# Patient Record
Sex: Female | Born: 1970 | Race: White | Hispanic: No | Marital: Married | Smoking: Never smoker
Health system: Southern US, Community
[De-identification: ages and names within clinical notes are randomized; demographics above are authoritative.]

## PROBLEM LIST (undated history)

## (undated) DIAGNOSIS — E079 Disorder of thyroid, unspecified: Secondary | ICD-10-CM

## (undated) HISTORY — PX: ABDOMINAL HYSTERECTOMY: SHX81

## (undated) HISTORY — PX: TONSILLECTOMY: SUR1361

---

## 1998-12-24 ENCOUNTER — Other Ambulatory Visit: Admission: RE | Admit: 1998-12-24 | Discharge: 1998-12-24 | Payer: Self-pay | Admitting: Gynecology

## 1999-10-05 ENCOUNTER — Other Ambulatory Visit: Admission: RE | Admit: 1999-10-05 | Discharge: 1999-10-05 | Payer: Self-pay | Admitting: Gynecology

## 2000-03-06 ENCOUNTER — Other Ambulatory Visit: Admission: RE | Admit: 2000-03-06 | Discharge: 2000-03-06 | Payer: Self-pay | Admitting: Gynecology

## 2000-09-07 ENCOUNTER — Other Ambulatory Visit: Admission: RE | Admit: 2000-09-07 | Discharge: 2000-09-07 | Payer: Self-pay | Admitting: Gynecology

## 2001-07-03 ENCOUNTER — Other Ambulatory Visit: Admission: RE | Admit: 2001-07-03 | Discharge: 2001-07-03 | Payer: Self-pay | Admitting: Gynecology

## 2002-05-21 ENCOUNTER — Observation Stay (HOSPITAL_COMMUNITY): Admission: AD | Admit: 2002-05-21 | Discharge: 2002-05-22 | Payer: Self-pay | Admitting: Gynecology

## 2002-05-21 ENCOUNTER — Encounter: Payer: Self-pay | Admitting: Gynecology

## 2002-05-21 ENCOUNTER — Encounter (INDEPENDENT_AMBULATORY_CARE_PROVIDER_SITE_OTHER): Payer: Self-pay

## 2007-12-04 ENCOUNTER — Emergency Department (HOSPITAL_BASED_OUTPATIENT_CLINIC_OR_DEPARTMENT_OTHER): Admission: EM | Admit: 2007-12-04 | Discharge: 2007-12-05 | Payer: Self-pay | Admitting: Emergency Medicine

## 2008-08-06 ENCOUNTER — Emergency Department (HOSPITAL_BASED_OUTPATIENT_CLINIC_OR_DEPARTMENT_OTHER): Admission: EM | Admit: 2008-08-06 | Discharge: 2008-08-06 | Payer: Self-pay | Admitting: Emergency Medicine

## 2009-05-22 ENCOUNTER — Emergency Department (HOSPITAL_BASED_OUTPATIENT_CLINIC_OR_DEPARTMENT_OTHER): Admission: EM | Admit: 2009-05-22 | Discharge: 2009-05-22 | Payer: Self-pay | Admitting: Emergency Medicine

## 2010-02-01 ENCOUNTER — Ambulatory Visit: Payer: Self-pay | Admitting: Diagnostic Radiology

## 2010-02-01 ENCOUNTER — Emergency Department (HOSPITAL_BASED_OUTPATIENT_CLINIC_OR_DEPARTMENT_OTHER): Admission: EM | Admit: 2010-02-01 | Discharge: 2010-02-01 | Payer: Self-pay | Admitting: Emergency Medicine

## 2010-08-11 LAB — URINALYSIS, ROUTINE W REFLEX MICROSCOPIC
Bilirubin Urine: NEGATIVE
Glucose, UA: NEGATIVE mg/dL
Hgb urine dipstick: NEGATIVE
Specific Gravity, Urine: 1.022 (ref 1.005–1.030)

## 2010-08-11 LAB — CBC
HCT: 42.5 % (ref 36.0–46.0)
Hemoglobin: 14.6 g/dL (ref 12.0–15.0)
MCH: 33.4 pg (ref 26.0–34.0)
MCHC: 34.4 g/dL (ref 30.0–36.0)
MCV: 97.1 fL (ref 78.0–100.0)
Platelets: 291 10*3/uL (ref 150–400)
RBC: 4.37 MIL/uL (ref 3.87–5.11)
RDW: 11.9 % (ref 11.5–15.5)
WBC: 18.8 10*3/uL — ABNORMAL HIGH (ref 4.0–10.5)

## 2010-08-11 LAB — COMPREHENSIVE METABOLIC PANEL
ALT: 18 U/L (ref 0–35)
AST: 23 U/L (ref 0–37)
Albumin: 4.3 g/dL (ref 3.5–5.2)
Alkaline Phosphatase: 114 U/L (ref 39–117)
BUN: 16 mg/dL (ref 6–23)
CO2: 26 mEq/L (ref 19–32)
Calcium: 9.7 mg/dL (ref 8.4–10.5)
Chloride: 105 mEq/L (ref 96–112)
Creatinine, Ser: 0.7 mg/dL (ref 0.4–1.2)
GFR calc Af Amer: >60 mL/min (ref >60)
GFR calc non Af Amer: >60 mL/min (ref >60)
Glucose, Bld: 93 mg/dL (ref 70–99)
Potassium: 4.1 mEq/L (ref 3.5–5.1)
Sodium: 142 mEq/L (ref 135–145)
Total Bilirubin: 0.8 mg/dL (ref 0.3–1.2)
Total Protein: 8 g/dL (ref 6.0–8.3)

## 2010-08-11 LAB — DIFFERENTIAL
Basophils Absolute: 0.1 10*3/uL (ref 0.0–0.1)
Basophils Relative: 1 % (ref 0–1)
Eosinophils Absolute: 0.1 10*3/uL (ref 0.0–0.7)
Eosinophils Relative: 0 % (ref 0–5)
Lymphocytes Relative: 5 % — ABNORMAL LOW (ref 12–46)
Lymphs Abs: 0.9 10*3/uL (ref 0.7–4.0)
Monocytes Absolute: 0.9 10*3/uL (ref 0.1–1.0)
Monocytes Relative: 5 % (ref 3–12)
Neutro Abs: 16.8 10*3/uL — ABNORMAL HIGH (ref 1.7–7.7)
Neutrophils Relative %: 89 % — ABNORMAL HIGH (ref 43–77)

## 2010-08-29 LAB — URINE CULTURE

## 2010-08-29 LAB — URINALYSIS, ROUTINE W REFLEX MICROSCOPIC
Bilirubin Urine: NEGATIVE
Ketones, ur: NEGATIVE mg/dL
Protein, ur: 100 mg/dL — AB
Urobilinogen, UA: 0.2 mg/dL (ref 0.0–1.0)

## 2010-08-29 LAB — BASIC METABOLIC PANEL
BUN: 17 mg/dL (ref 6–23)
CO2: 28 mEq/L (ref 19–32)
Calcium: 9.8 mg/dL (ref 8.4–10.5)
GFR calc non Af Amer: >60 mL/min (ref >60)
Glucose, Bld: 104 mg/dL — ABNORMAL HIGH (ref 70–99)

## 2010-08-29 LAB — URINE MICROSCOPIC-ADD ON

## 2010-08-29 LAB — PREGNANCY, URINE: Preg Test, Ur: NEGATIVE

## 2010-09-08 LAB — CBC
HCT: 49.8 % — ABNORMAL HIGH (ref 36.0–46.0)
MCHC: 34.3 g/dL (ref 30.0–36.0)
Platelets: 340 10*3/uL (ref 150–400)
RDW: 11.6 % (ref 11.5–15.5)

## 2010-09-08 LAB — URINALYSIS, ROUTINE W REFLEX MICROSCOPIC
Glucose, UA: NEGATIVE mg/dL
Leukocytes, UA: NEGATIVE
Protein, ur: 100 mg/dL — AB
Specific Gravity, Urine: 1.022 (ref 1.005–1.030)
pH: 5.5 (ref 5.0–8.0)

## 2010-09-08 LAB — URINE MICROSCOPIC-ADD ON

## 2010-09-08 LAB — COMPREHENSIVE METABOLIC PANEL
Albumin: 5.4 g/dL — ABNORMAL HIGH (ref 3.5–5.2)
Alkaline Phosphatase: 100 U/L (ref 39–117)
BUN: 14 mg/dL (ref 6–23)
Calcium: 10.8 mg/dL — ABNORMAL HIGH (ref 8.4–10.5)
Potassium: 3.6 mEq/L (ref 3.5–5.1)
Sodium: 143 mEq/L (ref 135–145)
Total Protein: 9.4 g/dL — ABNORMAL HIGH (ref 6.0–8.3)

## 2010-09-08 LAB — DIFFERENTIAL
Basophils Relative: 1 % (ref 0–1)
Eosinophils Absolute: 0 10*3/uL (ref 0.0–0.7)
Eosinophils Relative: 0 % (ref 0–5)
Lymphocytes Relative: 5 % — ABNORMAL LOW (ref 12–46)
Neutro Abs: 16.9 10*3/uL — ABNORMAL HIGH (ref 1.7–7.7)
Neutrophils Relative %: 90 % — ABNORMAL HIGH (ref 43–77)

## 2010-10-14 NOTE — Op Note (Signed)
NAME:  Robin Brennan, Robin Brennan                           ACCOUNT NO.:  [id number]   MEDICAL RECORD NO.:  [id number]                   PATIENT TYPE:  INP   LOCATION:  9116                                 FACILITY:  [facility]   PHYSICIAN:  Ivor Costa. Farrel Gobble, M.D.              DATE OF BIRTH:  12-17-1970   DATE OF PROCEDURE:  05/21/2002  DATE OF DISCHARGE:  05/22/2002                                 OPERATIVE REPORT   PREOPERATIVE DIAGNOSES:  1. Inappropriately rising beta human chorionic gonadotropin.  2. Elevated liver function tests.  3. No villi on office dilatation and curettage.  4. Presumed left ectopic.   POSTOPERATIVE DIAGNOSIS:  1. Inappropriately rising beta human chorionic gonadotropin.  2. Elevated liver function tests.  3. No villi on office dilatation and curettage.  4. Presumed left ectopic.  5. Hemoperitoneum.  6. Bowel adhesions.  7. Right ovarian cyst.   PROCEDURES:  1. Diagnostic laparoscopy.  2. Lysis of adhesions.  3. Right ovarian cystectomy.  4. Dilation and curettage.  5. Evacuation of hemoperitoneum.   SURGEON:  Ivor Costa. Farrel Gobble, M.D.   ANESTHESIA:  General.   ESTIMATED BLOOD LOSS:  Estimated blood loss intraoperatively was minimal.   FINDINGS:  There were anterior abdominal wall bowel adhesions.  The bowel  was also adherent to the left adnexa.  There were 200 cc of clotted and  nonclotted blood in the pelvis.  Both tubes were bilaterally normal as well  as lush fimbriae seen bilaterally.  The uterus was unremarkable.  There was  a large right-sided ovarian cyst.   COMPLICATIONS:  None.   PATHOLOGY:  Endometrial curettings and right ovarian cyst wall.   DESCRIPTION OF PROCEDURE:  The patient was taken to the operating room and  general anesthesia was induced, placed in the dorsal lithotomy position,  prepped and draped in the usual sterile fashion.  A bivalve speculum was  placed in the vagina and the cervix was visualized,  stabilized with a  single-tooth  tenaculum.  The uterine manipulator was then placed.  Attention  was then turned to the abdomen after gloves were changed and an  infraumbilical incision was made through the scalpel, through which the  Veress needle was inserted.  Free flow of fluid was noted.  Pneumoperitoneum  was created until tympany was appreciated above the liver, after which a #10  disposable trocar was inserted through the infraumbilical port.  Placement  in the abdominal cavity was confirmed.  Findings were as noted.  The bowel  adhesions in the pelvis somewhat limited our access.  Using the monopolar  shears, the anterior abdominal wall adhesions were sharply taken down.  They  were noted to be hemostatic.  Once this was done, a #5 port was placed in  the suprapubic area under direct visualization, through which a suction  irrigator was placed and the hemoperitoneum was evacuated.  Inspection of  the adnexa was  somewhat limited secondary to the bowel adhesions on the  left, and we were unable to adequately see most of the tube.  The bowel  adhesions were then sharply and bluntly taken down and the left tube was  able to be evaluated.  It was noted to be unremarkable and was similar when  compared to the right.  The tube was gently probed with the suction  irrigator and no abnormalities were appreciated.  There was a rather large  cyst on the right.  The tubo-ovarian ligament was then grasped.  A second  port on the right lower quadrant was placed under direct visualization.  Using the two ports, the ovary was stabilized and a cystotomy was performed  for what appeared to be benign fluid.  At the time, the cyst wall was then  bluntly dissected off the ovary using the three available ports.  The base  of the cyst was irrigated with copious amounts of saline.  There was a small  amount of bleeding that was treated with the Kleppinger's.  Re-irrigation  confirmed hemostasis.  Again we reinspected the two fimbriae and  tubes, and  again symmetry was appreciated.  Because we could not find any evidence of  ectopic pregnancy, our suspicion was that perhaps she ruptured the ectopic  from the fimbriated end or it was not a tubal pregnancy.  The instruments  were then removed and pneumoperitoneum was released.  The infraumbilical  port was closed with a figure-of-eight of 0 Vicryl.  The infraumbilical skin  was closed with 3-0 plain.  The lower ports were closed with tincture of  Benzoin and Steri-Strips.  All three ports were injected with 0.25% Marcaine  solution for a total of 10 cc.  Because the ectopic had not been seen and  the D&C was done in the office, we elected to go ahead and do another sharp  curetting.  The bivalve speculum was placed in the vagina again.  The  uterine manipulator was removed.  The cervix was stabilized with a single-  tooth tenaculum.  It sounded to 8 cm.  It was dilated up to 21 [place].  A  gentle curettage was performed.  Good uterine cri was noted.  Scant tissue  was obtained.  This was also sent for pathology.  The instruments were then  removed from the vagina.  The cervix was noted to be hemostatic.  The  patient tolerated the procedure well.  Sponge, needle, and lap counts were  correct x2.  She was extubated in the OR and transferred to the PACU in  stable condition.                                               Ivor Costa. Farrel Gobble, M.D.    THL/MEDQ  D:  05/21/2002  T:  05/23/2002  Job:  [id number]

## 2010-10-14 NOTE — Consult Note (Signed)
NAME:  Robin Brennan, Robin Brennan                           ACCOUNT NO.:  [id number]   MEDICAL RECORD NO.:  [id number]                   PATIENT TYPE:  INP   LOCATION:  9116                                 FACILITY:  [facility]   PHYSICIAN:  Ivor Costa. Farrel Gobble, M.D.              DATE OF BIRTH:  07-26-1970   DATE OF CONSULTATION:  DATE OF DISCHARGE:  05/22/2002                                   CONSULTATION   REASON FOR CONSULTATION:  The patient is a 40 year old with a history of  inappropriately-rising beta hCG who underwent a ___________in the office  which came back consistent with secretory endometrium and no villi.  The  patient then presented to triage for appropriate lab work for methotrexate  therapy.  The patient had a CBC done which was unremarkable with normal  white count, H&H, and platelets of 226.  She also had a normal differential.  She had an SGOT that came back elevated at 80; however, she does not have  any GI symptomatology.  It was repeated with a full liver function panel  which was normal with the exception of SGOT then was 80; SGPT was 110.   These findings were discussed with the patient and her husband.  Because of  the elevation in her liver function tests which were about two-and-a-half  times normal, she is therefore not a candidate for methotrexate therapy.  Therefore, we would recommend going ahead and doing a laparoscopic  salpingostomy, possible salpingectomy.  Unfortunately, the patient has eaten  just prior to getting lab work done.  She is asymptomatic, i.e., no nausea  or vomiting.  She has no icterus or jaundice.  No right upper quadrant pain.  No nausea or vomiting.  We briefly discussed the differential for isolated  elevated liver function tests.  We briefly reviewed laparoscopic procedure  including possible risk of loss of the tube.  The patient is asymptomatic.  Because it is Christmas Eve and she has just eaten, she is not a candidate  for surgery until the  middle of the evening.  It is currently 3:00.  We  recommend that she come back for surgery tomorrow.  Because it is Christmas  Day, the patient requested she could possibly come back on the 26th then as  she is completely asymptomatic and her bimanual exam was negative.  The  patient was given strict precautions that should she develop any left lower  quadrant pain, she is to call immediately; otherwise, she is scheduled for  surgery on the 26th at 9:15 a.m.  In the meantime, we will continue to  evaluate her isolated elevated liver function tests with a hepatitis panel  and a gallbladder ultrasound.  All questions were addressed, and we will  plan on seeing her in the morning of the 26th.  Ivor Costa. Farrel Gobble, M.D.    THL/MEDQ  D:  05/21/2002  T:  05/22/2002  Job:  [id number]

## 2011-02-23 LAB — COMPREHENSIVE METABOLIC PANEL WITH GFR
ALT: 17
AST: 29
Albumin: 4.8
Alkaline Phosphatase: 74
BUN: 11
CO2: 25
Calcium: 9.4
Chloride: 104
Creatinine, Ser: 0.8
GFR calc non Af Amer: >60
Glucose, Bld: 100 — ABNORMAL HIGH
Potassium: 3.8
Sodium: 141
Total Bilirubin: 0.9
Total Protein: 8.1

## 2011-02-23 LAB — URINALYSIS, ROUTINE W REFLEX MICROSCOPIC
Bilirubin Urine: NEGATIVE
Glucose, UA: NEGATIVE
Hgb urine dipstick: NEGATIVE
Ketones, ur: 15 — AB
Nitrite: NEGATIVE
Protein, ur: NEGATIVE
Specific Gravity, Urine: 1.029
Urobilinogen, UA: 0.2
pH: 5.5

## 2011-02-23 LAB — DIFFERENTIAL
Basophils Absolute: 0.1
Basophils Relative: 1
Eosinophils Absolute: 0.1
Eosinophils Relative: 1
Lymphocytes Relative: 9 — ABNORMAL LOW
Lymphs Abs: 0.9
Monocytes Absolute: 0.6
Monocytes Relative: 6
Neutro Abs: 8.6 — ABNORMAL HIGH
Neutrophils Relative %: 83 — ABNORMAL HIGH
Smear Review: NORMAL

## 2011-02-23 LAB — LIPASE, BLOOD: Lipase: 47

## 2011-02-23 LAB — CBC
MCHC: 34.8
MCV: 95.6
RBC: 4.73
RDW: 11.4 — ABNORMAL LOW

## 2012-11-12 ENCOUNTER — Emergency Department (HOSPITAL_BASED_OUTPATIENT_CLINIC_OR_DEPARTMENT_OTHER)
Admission: EM | Admit: 2012-11-12 | Discharge: 2012-11-13 | Disposition: A | Discharge status: Home / Self Care | Payer: BC Managed Care – PPO | Attending: Emergency Medicine | Admitting: Emergency Medicine

## 2012-11-12 ENCOUNTER — Encounter (HOSPITAL_BASED_OUTPATIENT_CLINIC_OR_DEPARTMENT_OTHER): Payer: Self-pay

## 2012-11-12 DIAGNOSIS — Z90710 Acquired absence of both cervix and uterus: Secondary | ICD-10-CM | POA: Insufficient documentation

## 2012-11-12 DIAGNOSIS — R197 Diarrhea, unspecified: Secondary | ICD-10-CM | POA: Insufficient documentation

## 2012-11-12 DIAGNOSIS — R509 Fever, unspecified: Secondary | ICD-10-CM | POA: Insufficient documentation

## 2012-11-12 DIAGNOSIS — R112 Nausea with vomiting, unspecified: Secondary | ICD-10-CM

## 2012-11-12 DIAGNOSIS — R1084 Generalized abdominal pain: Secondary | ICD-10-CM | POA: Insufficient documentation

## 2012-11-12 LAB — CBC WITH DIFFERENTIAL/PLATELET
Eosinophils Relative: 1 % (ref 0–5)
HCT: 41.2 % (ref 36.0–46.0)
Hemoglobin: 14.6 g/dL (ref 12.0–15.0)
Lymphocytes Relative: 4 % — ABNORMAL LOW (ref 12–46)
Lymphs Abs: 0.5 10*3/uL — ABNORMAL LOW (ref 0.7–4.0)
MCV: 95.8 fL (ref 78.0–100.0)
Monocytes Absolute: 0.7 10*3/uL (ref 0.1–1.0)
Monocytes Relative: 5 % (ref 3–12)
Neutro Abs: 12.4 10*3/uL — ABNORMAL HIGH (ref 1.7–7.7)
RDW: 12.2 % (ref 11.5–15.5)
WBC: 13.7 10*3/uL — ABNORMAL HIGH (ref 4.0–10.5)

## 2012-11-12 LAB — COMPREHENSIVE METABOLIC PANEL
AST: 20 U/L (ref 0–37)
Albumin: 4.5 g/dL (ref 3.5–5.2)
BUN: 22 mg/dL (ref 6–23)
Chloride: 100 mEq/L (ref 96–112)
Creatinine, Ser: 0.7 mg/dL (ref 0.50–1.10)
Potassium: 3.5 mEq/L (ref 3.5–5.1)
Total Bilirubin: 0.9 mg/dL (ref 0.3–1.2)
Total Protein: 7.4 g/dL (ref 6.0–8.3)

## 2012-11-12 LAB — LIPASE, BLOOD: Lipase: 25 U/L (ref 11–59)

## 2012-11-12 MED ORDER — SODIUM CHLORIDE 0.9 % IV SOLN
Freq: Once | INTRAVENOUS | Status: AC
  Administered 2012-11-12: 22:00:00 via INTRAVENOUS

## 2012-11-12 MED ORDER — MORPHINE SULFATE 4 MG/ML IJ SOLN: 4.0000 mg | Freq: Once | INTRAMUSCULAR | Status: DC

## 2012-11-12 MED ORDER — SODIUM CHLORIDE 0.9 % IV SOLN: 1000.0000 mL | INTRAVENOUS | Status: DC

## 2012-11-12 MED ORDER — ONDANSETRON HCL 4 MG/2ML IJ SOLN
INTRAMUSCULAR | Status: AC
  Filled 2012-11-12: qty 2

## 2012-11-12 MED ORDER — SODIUM CHLORIDE 0.9 % IV SOLN
1000.0000 mL | Freq: Once | INTRAVENOUS | Status: AC
  Administered 2012-11-12: 1000 mL via INTRAVENOUS

## 2012-11-12 MED ORDER — ONDANSETRON HCL 4 MG/2ML IJ SOLN: 4.0000 mg | Freq: Once | INTRAMUSCULAR | Status: DC

## 2012-11-12 MED ORDER — ONDANSETRON HCL 4 MG/2ML IJ SOLN
4.0000 mg | Freq: Once | INTRAMUSCULAR | Status: AC
  Administered 2012-11-12: 4 mg via INTRAVENOUS

## 2012-11-12 NOTE — ED Notes (Signed)
N/v/d started today-2 children with GI s/s

## 2012-11-12 NOTE — ED Provider Notes (Signed)
History     CSN: [id number]  Arrival date & time 11/12/12  2057   First MD Initiated Contact with Patient 11/12/12 2136      Chief Complaint  Patient presents with  . Emesis     Patient is a 42 y.o. female presenting with vomiting. The history is provided by the patient.  Emesis Severity:  Moderate Duration:  1 day Timing:  Intermittent Number of daily episodes:  More than 10 Emesis appearance: no blood. Feeding tolerance: nothing. Progression:  Worsening Associated symptoms: abdominal pain, diarrhea and fever   Abdominal pain:    Location:  Generalized   Severity:  Severe   Timing:  Intermittent   Chronicity:  New Diarrhea:    Quality:  Watery   Number of occurrences:  More than 10   Severity:  Severe   Duration:  1 day Pt's children had a stomach bug recently as well.  History reviewed. No pertinent past medical history.  Past Surgical History  Procedure Laterality Date  . Abdominal hysterectomy    . Tonsillectomy      No family history on file.  History  Substance Use Topics  . Smoking status: Never Smoker   . Smokeless tobacco: Not on file  . Alcohol Use: No    OB History   Grav Para Term Preterm Abortions TAB SAB Ect Mult Living                  Review of Systems  Gastrointestinal: Positive for vomiting, abdominal pain and diarrhea.  All other systems reviewed and are negative.    Allergies  Aloe; Tape; Augmentin; and Sulfa antibiotics  Home Medications   Current Outpatient Rx  Name  Route  Sig  Dispense  Refill  . ibuprofen (ADVIL,MOTRIN) 200 MG tablet   Oral   Take 200 mg by mouth every 6 (six) hours as needed for pain.         . promethazine (PHENERGAN) 25 MG tablet   Oral   Take 25 mg by mouth every 6 (six) hours as needed for nausea.         . ondansetron (ZOFRAN ODT) 8 MG disintegrating tablet   Oral   Take 1 tablet (8 mg total) by mouth every 8 (eight) hours as needed for nausea.   20 tablet   0     BP 98/57   Pulse 85  Temp(Src) 100.8 F (38.2 C) (Oral)  Resp 14  Ht 5\' 8"  (1.727 m)  Wt 135 lb (61.236 kg)  BMI 20.53 kg/m2  SpO2 100%  Physical Exam  Nursing note and vitals reviewed. Constitutional: She appears well-developed and well-nourished. No distress.  HENT:  Head: Normocephalic and atraumatic.  Right Ear: External ear normal.  Left Ear: External ear normal.  Eyes: Conjunctivae are normal. Right eye exhibits no discharge. Left eye exhibits no discharge. No scleral icterus.  Neck: Neck supple. No tracheal deviation present.  Cardiovascular: Normal rate, regular rhythm and intact distal pulses.   Pulmonary/Chest: Effort normal and breath sounds normal. No stridor. No respiratory distress. She has no wheezes. She has no rales.  Abdominal: Soft. Bowel sounds are normal. She exhibits no distension. There is generalized tenderness. There is no rigidity, no rebound and no guarding. No hernia.  Musculoskeletal: She exhibits no edema and no tenderness.  Neurological: She is alert. She has normal strength. No sensory deficit. Cranial nerve deficit:  no gross defecits noted. She exhibits normal muscle tone. She displays no seizure activity. Coordination  normal.  Skin: Skin is warm and dry. No rash noted.  Psychiatric: She has a normal mood and affect.    ED Course  Procedures (including critical care time) Medications  0.9 %  sodium chloride infusion (1,000 mLs Intravenous New Bag/Given 11/12/12 2316)    Followed by  0.9 %  sodium chloride infusion (not administered)  morphine 4 MG/ML injection 4 mg (not administered)  ondansetron (ZOFRAN) injection 4 mg (4 mg Intravenous Given 11/12/12 2143)  0.9 %  sodium chloride infusion ( Intravenous Stopped 11/12/12 2245)     Labs Reviewed  CBC WITH DIFFERENTIAL - Abnormal; Notable for the following:    WBC 13.7 (*)    Neutrophils Relative % 91 (*)    Neutro Abs 12.4 (*)    Lymphocytes Relative 4 (*)    Lymphs Abs 0.5 (*)    All other components  within normal limits  COMPREHENSIVE METABOLIC PANEL  LIPASE, BLOOD  URINALYSIS, ROUTINE W REFLEX MICROSCOPIC   No results found.   1. Nausea and vomiting in adult       MDM  I discussed performing imaging tests with the patient and her husband. She was pretty certain that her illness was a stomach virus that she got from her children. Patient states she's had this type of reaction in the past. She would prefer not to have any x-ray imaging at this time. Overall I do have a low suspicion for bowel obstruction or other emergency surgical condition.    Patient was treated with IV fluids and medications for pain and cramping. She improved significantly. On repeat exam she did not have any abdominal tenderness. Patient is comfortable with going home and she understands to return to emergency room for any worsening symptoms.        Celene Kras, MD 11/13/12 [phone]

## 2012-11-13 MED ORDER — ONDANSETRON 8 MG PO TBDP: 8.0000 mg | ORAL_TABLET | Freq: Three times a day (TID) | ORAL | Status: DC | PRN

## 2012-11-13 NOTE — ED Notes (Signed)
Pt denies pain at this time. Morphine held.

## 2020-10-06 ENCOUNTER — Emergency Department (HOSPITAL_COMMUNITY)
Admission: EM | Admit: 2020-10-06 | Discharge: 2020-10-06 | Disposition: A | Discharge status: Home / Self Care | Payer: BC Managed Care – PPO | Attending: Emergency Medicine | Admitting: Emergency Medicine

## 2020-10-06 ENCOUNTER — Emergency Department (HOSPITAL_COMMUNITY): Payer: BC Managed Care – PPO

## 2020-10-06 ENCOUNTER — Other Ambulatory Visit: Payer: Self-pay

## 2020-10-06 ENCOUNTER — Encounter (HOSPITAL_COMMUNITY): Payer: Self-pay

## 2020-10-06 DIAGNOSIS — R079 Chest pain, unspecified: Secondary | ICD-10-CM

## 2020-10-06 DIAGNOSIS — R072 Precordial pain: Secondary | ICD-10-CM

## 2020-10-06 DIAGNOSIS — R0789 Other chest pain: Secondary | ICD-10-CM | POA: Insufficient documentation

## 2020-10-06 HISTORY — DX: Disorder of thyroid, unspecified: E07.9

## 2020-10-06 LAB — CBC WITH DIFFERENTIAL/PLATELET
Abs Immature Granulocytes: 0.06 10*3/uL (ref 0.00–0.07)
Basophils Absolute: 0.1 10*3/uL (ref 0.0–0.1)
Basophils Relative: 1 %
Eosinophils Absolute: 0.2 10*3/uL (ref 0.0–0.5)
Eosinophils Relative: 2 %
HCT: 43.1 % (ref 36.0–46.0)
Hemoglobin: 14.6 g/dL (ref 12.0–15.0)
Immature Granulocytes: 1 %
Lymphocytes Relative: 19 %
Lymphs Abs: 1.8 10*3/uL (ref 0.7–4.0)
MCH: 34.2 pg — ABNORMAL HIGH (ref 26.0–34.0)
MCHC: 33.9 g/dL (ref 30.0–36.0)
MCV: 100.9 fL — ABNORMAL HIGH (ref 80.0–100.0)
Monocytes Absolute: 0.8 10*3/uL (ref 0.1–1.0)
Monocytes Relative: 9 %
Neutro Abs: 6.8 10*3/uL (ref 1.7–7.7)
Neutrophils Relative %: 68 %
Platelets: 300 10*3/uL (ref 150–400)
RBC: 4.27 MIL/uL (ref 3.87–5.11)
RDW: 11.9 % (ref 11.5–15.5)
WBC: 9.7 10*3/uL (ref 4.0–10.5)
nRBC: 0 % (ref 0.0–0.2)

## 2020-10-06 LAB — COMPREHENSIVE METABOLIC PANEL
ALT: 22 U/L (ref 0–44)
AST: 24 U/L (ref 15–41)
Albumin: 4.2 g/dL (ref 3.5–5.0)
Alkaline Phosphatase: 51 U/L (ref 38–126)
Anion gap: 10 (ref 5–15)
BUN: 14 mg/dL (ref 6–20)
CO2: 23 mmol/L (ref 22–32)
Calcium: 9.2 mg/dL (ref 8.9–10.3)
Chloride: 105 mmol/L (ref 98–111)
Creatinine, Ser: 0.78 mg/dL (ref 0.44–1.00)
GFR, Estimated: >60 mL/min (ref >60)
Glucose, Bld: 107 mg/dL — ABNORMAL HIGH (ref 70–99)
Potassium: 3.6 mmol/L (ref 3.5–5.1)
Sodium: 138 mmol/L (ref 135–145)
Total Bilirubin: 1.1 mg/dL (ref 0.3–1.2)
Total Protein: 6.9 g/dL (ref 6.5–8.1)

## 2020-10-06 LAB — TROPONIN I (HIGH SENSITIVITY)
Troponin I (High Sensitivity): <2 ng/L (ref <18)
Troponin I (High Sensitivity): 3 ng/L (ref <18)

## 2020-10-06 LAB — D-DIMER, QUANTITATIVE: D-Dimer, Quant: 0.58 ug/mL-FEU — ABNORMAL HIGH (ref 0.00–0.50)

## 2020-10-06 MED ORDER — IOHEXOL 350 MG/ML SOLN
75.0000 mL | Freq: Once | INTRAVENOUS | Status: AC | PRN
  Administered 2020-10-06: 75 mL via INTRAVENOUS

## 2020-10-06 MED ORDER — ONDANSETRON HCL 4 MG/2ML IJ SOLN
4.0000 mg | Freq: Once | INTRAMUSCULAR | Status: AC
  Administered 2020-10-06: 4 mg via INTRAVENOUS
  Filled 2020-10-06: qty 2

## 2020-10-06 MED ORDER — FENTANYL CITRATE (PF) 100 MCG/2ML IJ SOLN
25.0000 ug | INTRAMUSCULAR | Status: DC | PRN
Start: 2020-10-06 — End: 2020-10-07
  Administered 2020-10-06: 25 ug via INTRAVENOUS
  Filled 2020-10-06: qty 2

## 2020-10-06 NOTE — ED Provider Notes (Signed)
Pt cleared for Dc by Dr. Gala Romney,  Pt agreeable   Eber Hong, MD 10/06/20 2103

## 2020-10-06 NOTE — ED Provider Notes (Signed)
[facility] EMERGENCY DEPARTMENT Provider Note   CSN: [id number] Arrival date & time: 10/06/20  1316     History Chief Complaint  Patient presents with  . Chest Pain    Robin Brennan is a 50 y.o. female.  She has had similar chest pain for about 6 months off and on.  This chest pain was more severe and longer lasting.  She has not sought any evaluation prior to this point.  The history is provided by the patient.  Chest Pain Pain location:  L chest Pain quality: dull and sharp   Pain quality comment:  Started sharp, now dull Pain radiates to:  Does not radiate Pain severity:  Moderate Onset quality:  Sudden Duration:  3 hours Timing:  Constant Progression:  Unchanged Chronicity:  New Context comment:  Occurred while grocery shopping Relieved by:  Nothing Worsened by:  Deep breathing Ineffective treatments:  Nitroglycerin Associated symptoms: no abdominal pain, no back pain, no cough, no diaphoresis, no fever, no lower extremity edema, no palpitations, no shortness of breath and no vomiting   Risk factors: no coronary artery disease and no hypertension        Past Medical History:  Diagnosis Date  . Thyroid disease     There are no problems to display for this patient.   Past Surgical History:  Procedure Laterality Date  . ABDOMINAL HYSTERECTOMY    . TONSILLECTOMY       OB History   No obstetric history on file.     No family history on file.  Social History   Tobacco Use  . Smoking status: Never Smoker  . Smokeless tobacco: Never Used  Substance Use Topics  . Alcohol use: Yes    Comment: couple glasses of wine daily   . Drug use: Not Currently    Home Medications Prior to Admission medications   Medication Sig Start Date End Date Taking? Authorizing Provider  ibuprofen (ADVIL,MOTRIN) 200 MG tablet Take 200 mg by mouth every 6 (six) hours as needed for pain.    [provider]  ondansetron (ZOFRAN ODT) 8 MG  disintegrating tablet Take 1 tablet (8 mg total) by mouth every 8 (eight) hours as needed for nausea. 11/13/12   Linwood Dibbles, MD  promethazine (PHENERGAN) 25 MG tablet Take 25 mg by mouth every 6 (six) hours as needed for nausea.    [provider]    Allergies    Aloe, Tape, Augmentin [amoxicillin-pot clavulanate], and Sulfa antibiotics  Review of Systems   Review of Systems  Constitutional: Negative for chills, diaphoresis and fever.  HENT: Negative for ear pain and sore throat.   Eyes: Negative for pain and visual disturbance.  Respiratory: Negative for cough and shortness of breath.   Cardiovascular: Positive for chest pain. Negative for palpitations.  Gastrointestinal: Negative for abdominal pain and vomiting.  Genitourinary: Negative for dysuria and hematuria.  Musculoskeletal: Negative for arthralgias and back pain.  Skin: Negative for color change and rash.  Neurological: Negative for seizures and syncope.  All other systems reviewed and are negative.   Physical Exam Updated Vital Signs BP 122/76   Pulse 71   Temp 98.6 F (37 C) (Oral)   Resp 18   Wt 61.2 kg   SpO2 98%   BMI 20.51 kg/m   Physical Exam Vitals and nursing note reviewed.  Constitutional:      General: She is not in acute distress.    Appearance: She is well-developed.  HENT:  Head: Normocephalic and atraumatic.  Eyes:     Conjunctiva/sclera: Conjunctivae normal.  Cardiovascular:     Rate and Rhythm: Normal rate and regular rhythm.     Heart sounds: No murmur heard.   Pulmonary:     Effort: Pulmonary effort is normal. No respiratory distress.     Breath sounds: Normal breath sounds.  Musculoskeletal:     Cervical back: Neck supple.  Skin:    General: Skin is warm and dry.  Neurological:     General: No focal deficit present.     Mental Status: She is alert.  Psychiatric:        Mood and Affect: Mood normal.     ED Results / Procedures / Treatments   Labs (all labs ordered  are listed, but only abnormal results are displayed) Labs Reviewed  D-DIMER, QUANTITATIVE - Abnormal; Notable for the following components:      Result Value   D-Dimer, Quant 0.58 (*)    All other components within normal limits  COMPREHENSIVE METABOLIC PANEL - Abnormal; Notable for the following components:   Glucose, Bld 107 (*)    All other components within normal limits  CBC WITH DIFFERENTIAL/PLATELET - Abnormal; Notable for the following components:   MCV 100.9 (*)    MCH 34.2 (*)    All other components within normal limits  TROPONIN I (HIGH SENSITIVITY)  TROPONIN I (HIGH SENSITIVITY)    EKG EKG Interpretation  Date/Time:  Wednesday Oct 06 2020 13:17:20 EDT Ventricular Rate:  65 PR Interval:  150 QRS Duration: 97 QT Interval:  423 QTC Calculation: 440 R Axis:   79 Text Interpretation: Sinus rhythm Nonspecific T abnormalities, anterior leads normal axis T wave inversion V3, V4 no acuteischemia Confirmed by Pieter Partridge ([id number]) on 10/06/2020 1:45:01 PM   Radiology CT Angio Chest PE W/Cm &/Or Wo Cm  Result Date: 10/06/2020 CLINICAL DATA:  PE suspected, low/intermediate prob, positive D-dimer Chest pain and shortness of breath. EXAM: CT ANGIOGRAPHY CHEST WITH CONTRAST TECHNIQUE: Multidetector CT imaging of the chest was performed using the standard protocol during bolus administration of intravenous contrast. Multiplanar CT image reconstructions and MIPs were obtained to evaluate the vascular anatomy. CONTRAST:  58mL OMNIPAQUE IOHEXOL 350 MG/ML SOLN COMPARISON:  Radiograph earlier today FINDINGS: Cardiovascular: There are no filling defects within the pulmonary arteries to suggest pulmonary embolus. There is sick aorta is normal in caliber. Heart is normal in size. No pericardial effusion. Mediastinum/Nodes: No mediastinal or hilar adenopathy. No visualized thyroid nodule. Decompressed esophagus. Lungs/Pleura: Hypoventilatory changes in the dependent lungs. No focal airspace  disease. No pleural fluid. No findings of pulmonary edema. The trachea and central bronchi are patent. No evidence of pulmonary mass or nodule. Upper Abdomen: No acute or unexpected findings. Musculoskeletal: There are no acute or suspicious osseous abnormalities. Incidental vertebral body hemangioma in T8. No chest wall soft tissue abnormality. Review of the MIP images confirms the above findings. IMPRESSION: No pulmonary embolus or acute intrathoracic abnormality. Electronically Signed   By: Narda Rutherford M.D.   On: 10/06/2020 20:33   DG Chest Port 1 View  Result Date: 10/06/2020 CLINICAL DATA:  Chest pain and shortness of breath today. EXAM: PORTABLE CHEST 1 VIEW COMPARISON:  None. FINDINGS: The cardiac silhouette, mediastinal and hilar contours are normal. The lungs are clear. No pleural effusions. No pneumothorax. The bony thorax is intact. IMPRESSION: No acute cardiopulmonary findings. Electronically Signed   By: Rudie Meyer M.D.   On: 10/06/2020 14:24    Procedures Procedures  Medications Ordered in ED Medications  ondansetron (ZOFRAN) injection 4 mg (4 mg Intravenous Given 10/06/20 1706)  iohexol (OMNIPAQUE) 350 MG/ML injection 75 mL (75 mLs Intravenous Contrast Given 10/06/20 1958)    ED Course  I have reviewed the triage vital signs and the nursing notes.  Pertinent labs & imaging results that were available during my care of the patient were reviewed by me and considered in my medical decision making (see chart for details).  Clinical Course as of 10/06/20 1528  Wed Oct 06, 2020  1347 Dr. Gala Romney was at the bedside upon her arrival and performed a quick bedside ultrasound that appeared normal. [AW]  1527 I spoke with the patient and her husband.  Her D-dimer is just over the upper limit of normal, and I told him that there is more research coming out that suggest that for low risk patients, the D-dimer threshold could be raised.  Nonetheless, given the very slight elevation,  I think it is worth shared decision making.  They understand the risk of radiation and I agree with proceeding with a CT scan.  Partially, the patient has been experiencing this pain for several months, and it may be nice to determine ulcerative pathology as well as to rule out PE. [AW]    Clinical Course User Index [AW] Koleen Distance, MD   MDM Rules/Calculators/A&P                          Bruce Donath presented with chest pain.  Chest pain was somewhat atypical in description.  She is low risk for both PE and ACS.  She was risk stratified and evaluated according to the heart pathway.  ED work-up was normal and not concerning for ACS or PE.  She was discharged home.  We talked about alternative pathology such as esophageal spasm, GERD, chest wall pain. Final Clinical Impression(s) / ED Diagnoses Final diagnoses:  Nonspecific chest pain    Rx / DC Orders ED Discharge Orders    None       Koleen Distance, MD 10/07/20 [phone]

## 2020-10-06 NOTE — ED Triage Notes (Signed)
Pt for the past 7 months has been having intermittent CP but states never this bad.  She was walking in the grocery store when she began having sharp generalized CP with nausea denies states nothing makes it better or worse

## 2020-10-06 NOTE — Discharge Instructions (Signed)
Please see Dr. Gala Romney in the clinic for recheck this week. ER for worsening symtpoms  Your testing was reassuring and normal

## 2020-10-06 NOTE — Consult Note (Signed)
Cardiology Consult Note   Primary Physician: Delilah Shan, MD PCP-Cardiologist:  None  Reason for Consultation: Chest pain   HPI:    Robin Brennan is seen today for evaluation of chest pain at the request of Dr. Lorre Munroe    [name] is a healthy 50 y/o woman with no known h/o heart disease. Began having episodes of CP about 6 months months abo. Transient episodes typically lasts 20-30 seconds and resolves.   Today was at the store and developed left-sided chest pressure. Came home and then developed presyncopal episode. Called 911. In ambulance got 2 NTGs without benefit.   I met her on arrival to ambulance bay.   ECG NSR with non-specific TWI anteriorly.   Denies trauma, recent fevers or chills.   I did bedside echo EF 55-60% RV ok. Personally reviewed  D-Dime 0.58  Hs trop < 2  Review of Systems: [y] = yes, _0  = no   . General: Weight gain _1 ; Weight loss _2 ; Anorexia _3 ; Fatigue _4 ; Fever _5 ; Chills _6 ; Weakness _7   . Cardiac: Chest pain/pressure [ y]; Resting SOB _8 ; Exertional SOB _9 ; Orthopnea _10 ; Pedal Edema _11 ; Palpitations _12 ; Presyncope Blue.Reese ]; Presyncope _13 ; Paroxysmal nocturnal dyspnea_14   . Pulmonary: Cough _15 ; Wheezing_16 ; Hemoptysis_17 ; Sputum _18 ; Snoring _19   . GI: Vomiting_20 ; Dysphagia_21 ; Melena_22 ; Hematochezia _23 ; Heartburn_24 ; Abdominal pain _25 ; Constipation _26 ; Diarrhea _27 ; BRBPR _28   . GU: Hematuria_29 ; Dysuria _30 ; Nocturia_31   . Vascular: Pain in legs with walking _32 ; Pain in feet with lying flat _33 ; Non-healing sores _34 ; Stroke _35 ; TIA _36 ; Slurred speech _37 ;  . Neuro: Headaches_38 ; Vertigo_39 ; Seizures_40 ; Paresthesias_41 ;Blurred vision _42 ; Diplopia _43 ; Vision changes _44   . Ortho/Skin: Arthritis _45 ; Joint pain _46 ; Muscle pain _47 ; Joint swelling _48 ; Back Pain _49 ; Rash _50   . Psych: Depression_51 ; Anxiety_52   . Heme: Bleeding problems _53 ; Clotting disorders _54 ; Anemia _55   . Endocrine: Diabetes _56 ; Thyroid  dysfunction_57   Home Medications Prior to Admission medications   Medication Sig Start Date End Date Taking? Authorizing Provider  aspirin-acetaminophen-caffeine (EXCEDRIN MIGRAINE) [phone] MG tablet Take 2 tablets by mouth every 6 (six) hours as needed for headache.   Yes [provider]  Cholecalciferol (VITAMIN D-3) 125 MCG (5000 UT) TABS Take 250 mcg by mouth daily.   Yes [provider]  cyanocobalamin (,VITAMIN B-12,) 1000 MCG/ML injection Inject 1,000 mcg into the muscle once a week.   Yes [provider]  eszopiclone (LUNESTA) 1 MG TABS tablet Take 2 mg by mouth at bedtime. 08/31/20  Yes [provider]  ferrous sulfate 325 (65 FE) MG tablet Take 325 mg by mouth daily.   Yes [provider]  ibuprofen (ADVIL,MOTRIN) 200 MG tablet Take 200 mg by mouth every 6 (six) hours as needed for pain.   Yes [provider]  NP THYROID 60 MG tablet Take 60 mg by mouth daily. 09/06/20  Yes [provider]    Past Medical History: Past Medical History:  Diagnosis Date  . Thyroid disease     Past Surgical History: Past Surgical History:  Procedure Laterality Date  . ABDOMINAL HYSTERECTOMY    . TONSILLECTOMY  Family History: No family history on file.  Social History: Social History   Socioeconomic History  . Marital status: Married    Spouse name: Not on file  . Number of children: Not on file  . Years of education: Not on file  . Highest education level: Not on file  Occupational History  . Not on file  Tobacco Use  . Smoking status: Never Smoker  . Smokeless tobacco: Never Used  Substance and Sexual Activity  . Alcohol use: Yes    Comment: couple glasses of wine daily   . Drug use: Not Currently  . Sexual activity: Not Currently  Other Topics Concern  . Not on file  Social History Narrative  . Not on file   Social Determinants of Health   Financial Resource Strain: Not on file  Food Insecurity: Not  on file  Transportation Needs: Not on file  Physical Activity: Not on file  Stress: Not on file  Social Connections: Not on file    Allergies:  Allergies  Allergen Reactions  . Aloe Other (See Comments)    Blisters   . Gluten Meal   . Tape   . Augmentin [Amoxicillin-Pot Clavulanate] Rash  . Sulfa Antibiotics Rash    Objective:    Vital Signs:   Temp:  [98.1 F (36.7 C)-98.6 F (37 C)] 98.1 F (36.7 C) (05/11 1519) Pulse Rate:  [63-72] 70 (05/11 1545) Resp:  [13-18] 16 (05/11 1545) BP: (116-134)/(73-90) 116/73 (05/11 1545) SpO2:  [96 %-98 %] 98 % (05/11 1545) Weight:  [61.2 kg] 61.2 kg (05/11 1320)    Weight change: Filed Weights   10/06/20 1320  Weight: 61.2 kg    Intake/Output:  No intake or output data in the 24 hours ending 10/06/20 1637    Physical Exam    General:  Well appearing. No resp difficulty HEENT: normal Neck: supple. JVP . Carotids 2+ bilat; no bruits. No lymphadenopathy or thyromegaly appreciated. Cor: PMI nondisplaced. Regular rate & rhythm. No rubs, gallops or murmurs. Chest sore to palpation  Lungs: clear Abdomen: soft, nontender, nondistended. No hepatosplenomegaly. No bruits or masses. Good bowel sounds. Extremities: no cyanosis, clubbing, rash, edema Neuro: alert & orientedx3, cranial nerves grossly intact. moves all 4 extremities w/o difficulty. Affect pleasant   Telemetry   NSR 70s Personally reviewed  Labs   Basic Metabolic Panel: Recent Labs  Lab 10/06/20 1321  NA 138  K 3.6  CL 105  CO2 23  GLUCOSE 107*  BUN 14  CREATININE 0.78  CALCIUM 9.2    Liver Function Tests: Recent Labs  Lab 10/06/20 1321  AST 24  ALT 22  ALKPHOS 51  BILITOT 1.1  PROT 6.9  ALBUMIN 4.2   No results for input(s): LIPASE, AMYLASE in the last 168 hours. No results for input(s): AMMONIA in the last 168 hours.  CBC: Recent Labs  Lab 10/06/20 1321  WBC 9.7  NEUTROABS 6.8  HGB 14.6  HCT 43.1  MCV 100.9*  PLT 300    Cardiac  Enzymes: No results for input(s): CKTOTAL, CKMB, CKMBINDEX, TROPONINI in the last 168 hours.  BNP: BNP (last 3 results) No results for input(s): BNP in the last 8760 hours.  ProBNP (last 3 results) No results for input(s): PROBNP in the last 8760 hours.   CBG: No results for input(s): GLUCAP in the last 168 hours.  Coagulation Studies: No results for input(s): LABPROT, INR in the last 72 hours.   Imaging   DG Chest [facility] 1 [address]  Result Date: 10/06/2020 CLINICAL DATA:  Chest pain and shortness of breath today. EXAM: PORTABLE CHEST 1 VIEW COMPARISON:  None. FINDINGS: The cardiac silhouette, mediastinal and hilar contours are normal. The lungs are clear. No pleural effusions. No pneumothorax. The bony thorax is intact. IMPRESSION: No acute cardiopulmonary findings. Electronically Signed   By: Marijo Sanes M.D.   On: 10/06/2020 14:24     Assessment/Plan   1. Chest pain - ECG mildly abnormal but echo and hs trop are very reassuring - d-dimer mildly elevated. -> chest CT pending  - suspect most likely MSK.  - If CT negative ok to go home with out patient f/u with me,     Length of Stay: 0  Glori Bickers, MD  10/06/2020, 4:37 PM  Advanced Heart Failure Team Pager [phone] (M-F; 7a - 5p)  Please contact [facility] for night-coverage after hours (4p -7a ) and weekends on [email or web address]

## 2020-10-12 ENCOUNTER — Other Ambulatory Visit (HOSPITAL_COMMUNITY): Payer: Self-pay | Admitting: Internal Medicine

## 2020-10-12 ENCOUNTER — Encounter (HOSPITAL_COMMUNITY): Payer: Self-pay

## 2020-10-12 ENCOUNTER — Other Ambulatory Visit: Payer: Self-pay

## 2020-10-12 ENCOUNTER — Ambulatory Visit (HOSPITAL_COMMUNITY)
Admission: RE | Admit: 2020-10-12 | Discharge: 2020-10-12 | Disposition: A | Discharge status: Home / Self Care | Payer: BC Managed Care – PPO | Source: Ambulatory Visit | Attending: Internal Medicine | Admitting: Internal Medicine

## 2020-10-12 ENCOUNTER — Ambulatory Visit (HOSPITAL_COMMUNITY): Admission: RE | Admit: 2020-10-12 | Payer: BC Managed Care – PPO | Source: Ambulatory Visit

## 2020-10-12 DIAGNOSIS — R42 Dizziness and giddiness: Secondary | ICD-10-CM

## 2020-11-15 ENCOUNTER — Telehealth (HOSPITAL_COMMUNITY): Payer: Self-pay | Admitting: *Deleted

## 2020-11-15 DIAGNOSIS — R42 Dizziness and giddiness: Secondary | ICD-10-CM

## 2020-11-15 NOTE — Telephone Encounter (Signed)
Per Dr Gala Romney pt needs CT of head w/o contrast for persistent dizziness. Order placed, will get PA from insurance and schedule

## 2020-11-22 ENCOUNTER — Other Ambulatory Visit: Payer: Self-pay | Admitting: Family Medicine

## 2020-11-22 ENCOUNTER — Other Ambulatory Visit (HOSPITAL_COMMUNITY): Payer: Self-pay | Admitting: Family Medicine

## 2020-11-22 DIAGNOSIS — R109 Unspecified abdominal pain: Secondary | ICD-10-CM

## 2020-11-23 ENCOUNTER — Ambulatory Visit (HOSPITAL_COMMUNITY)
Admission: RE | Admit: 2020-11-23 | Discharge: 2020-11-23 | Disposition: A | Discharge status: Home / Self Care | Payer: BC Managed Care – PPO | Source: Ambulatory Visit | Attending: Internal Medicine | Admitting: Internal Medicine

## 2020-11-23 ENCOUNTER — Other Ambulatory Visit: Payer: Self-pay

## 2020-11-23 DIAGNOSIS — R42 Dizziness and giddiness: Secondary | ICD-10-CM | POA: Insufficient documentation

## 2020-11-24 ENCOUNTER — Ambulatory Visit (HOSPITAL_COMMUNITY)
Admission: RE | Admit: 2020-11-24 | Discharge: 2020-11-24 | Disposition: A | Discharge status: Home / Self Care | Payer: BC Managed Care – PPO | Source: Ambulatory Visit | Attending: Family Medicine | Admitting: Family Medicine

## 2020-11-24 DIAGNOSIS — R109 Unspecified abdominal pain: Secondary | ICD-10-CM

## 2020-12-13 ENCOUNTER — Ambulatory Visit: Payer: BC Managed Care – PPO | Admitting: Internal Medicine

## 2022-03-15 IMAGING — CT CT ANGIO CHEST
2 of 6 series · 19 of 36 positions shown · IV contrast (omnipaque)
Comparison: Radiograph earlier today

CLINICAL DATA: PE suspected, low/intermediate prob, positive
D-dimer

Chest pain and shortness of breath.
EXAM:
CT ANGIOGRAPHY CHEST WITH CONTRAST
TECHNIQUE: Multidetector CT imaging of the chest was performed using the
standard protocol during bolus administration of intravenous
contrast. Multiplanar CT image reconstructions and MIPs were
obtained to evaluate the vascular anatomy.
CONTRAST:  75mL OMNIPAQUE IOHEXOL 350 MG/ML SOLN

[Series 7: pe thins · axial · 0.67mm/px · z∈[+804,+1089]mm · 18 of 453 slices shown]
[im 23/453  lung]
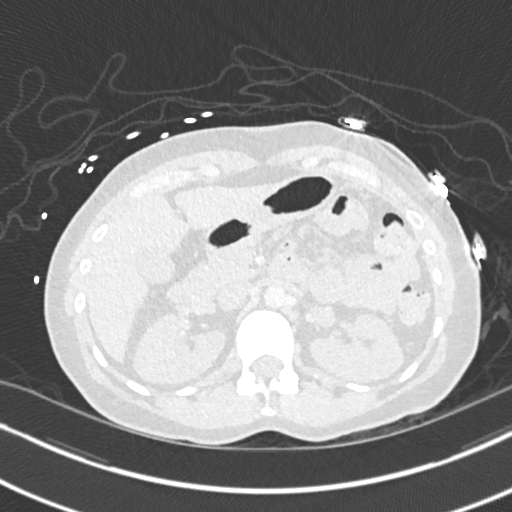
[im 46/453  mediastinal]
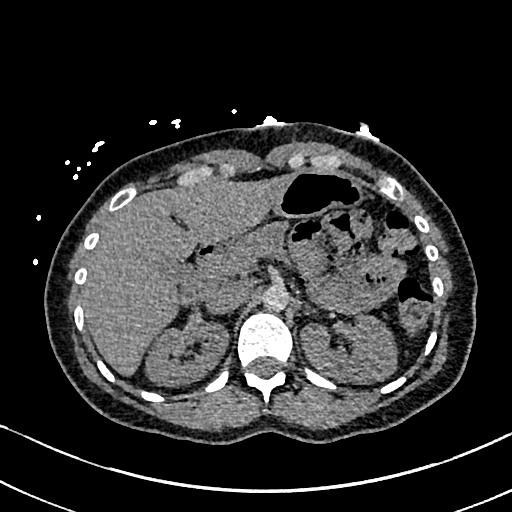
[im 68/453  lung]
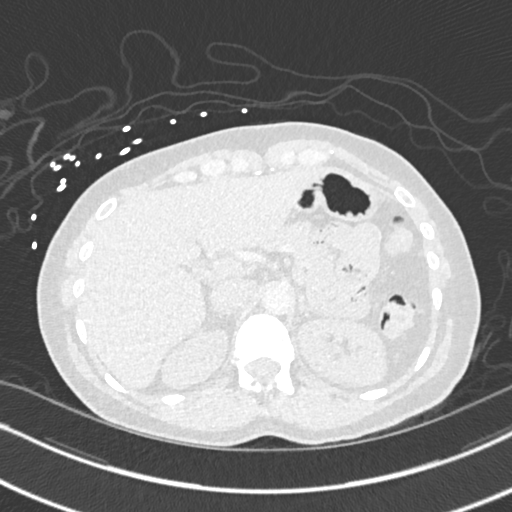
[im 91/453  mediastinal]
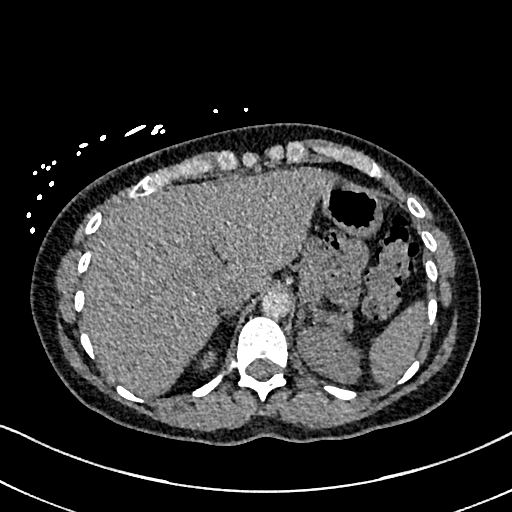
[im 114/453  lung]
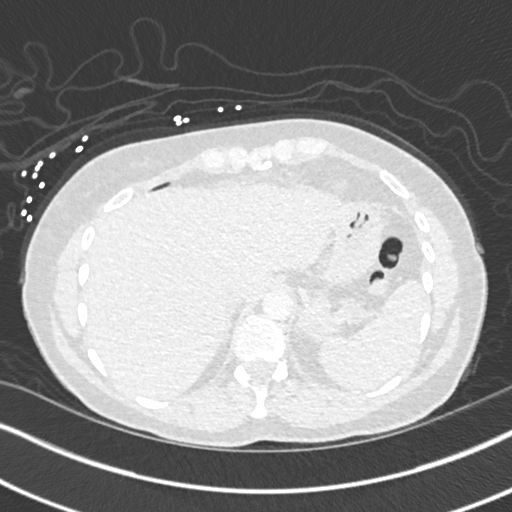
[im 136/453  mediastinal]
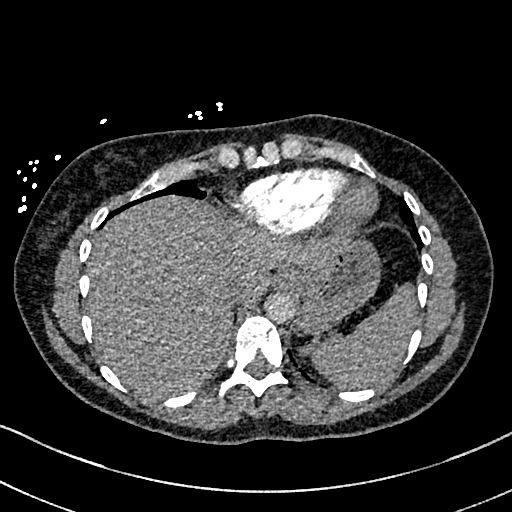
[im 159/453  lung]
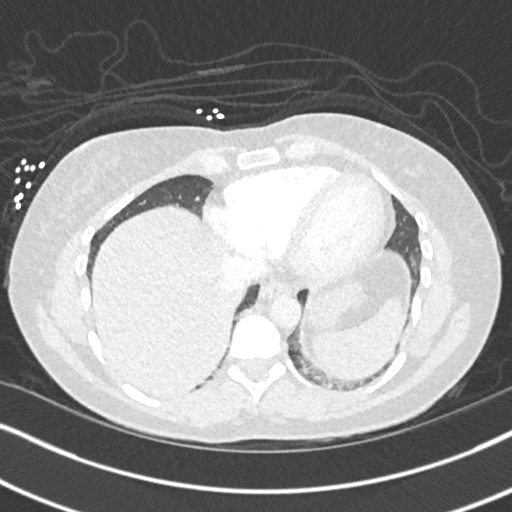
[im 181/453  mediastinal]
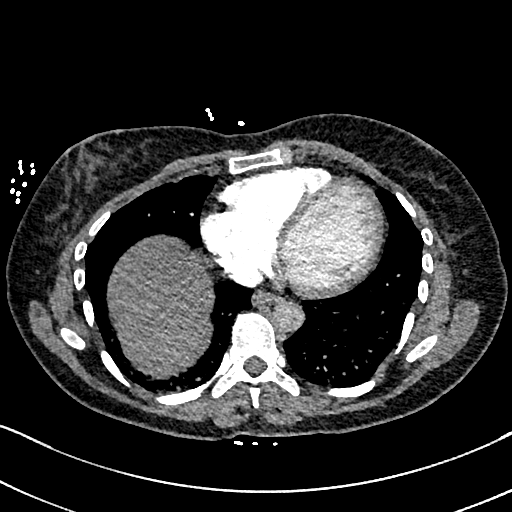
[im 204/453  lung]
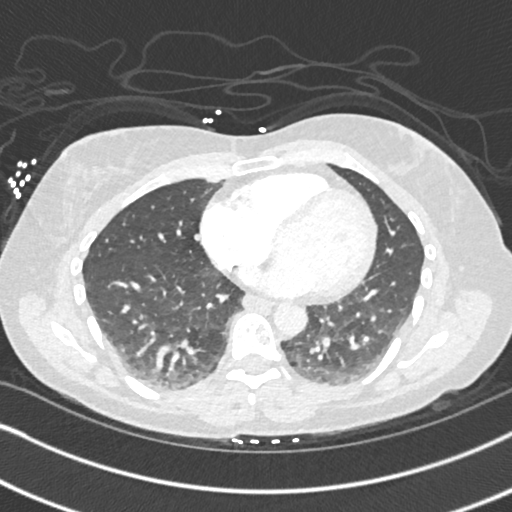
[im 249/453  mediastinal]
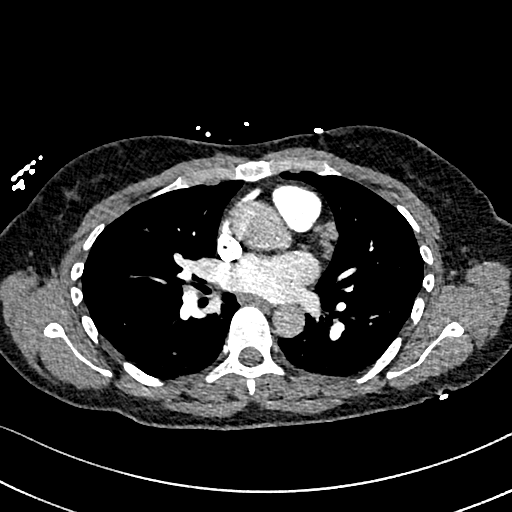
[im 272/453  lung]
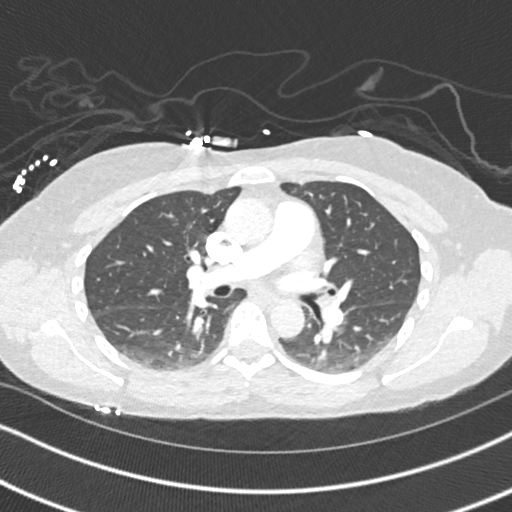
[im 294/453  mediastinal]
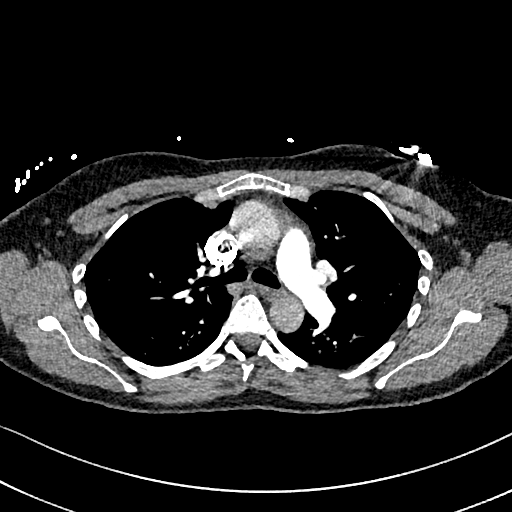
[im 317/453  lung]
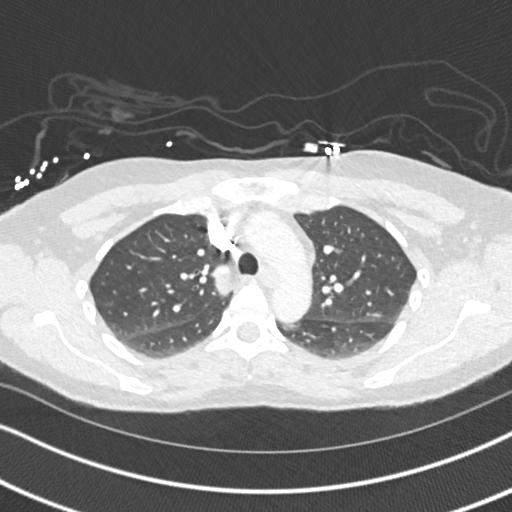
[im 340/453  mediastinal]
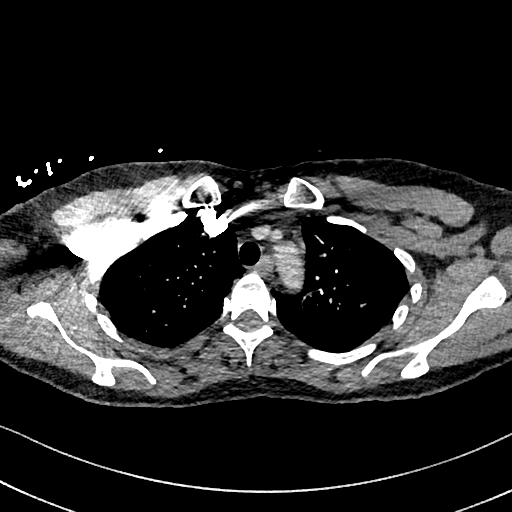
[im 362/453  lung]
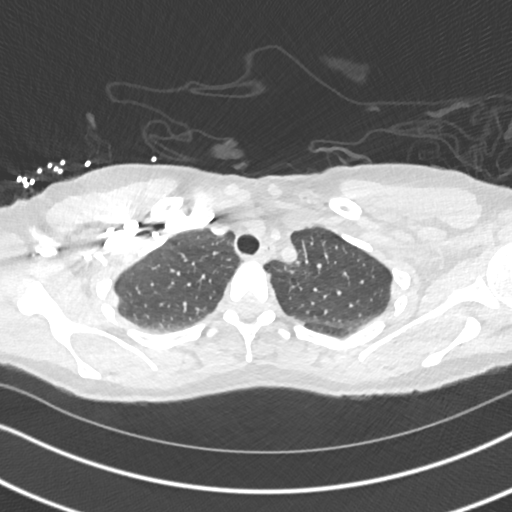
[im 385/453  mediastinal]
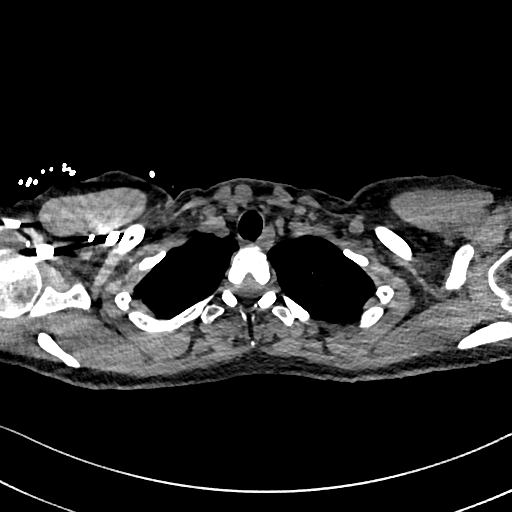
[im 407/453  lung]
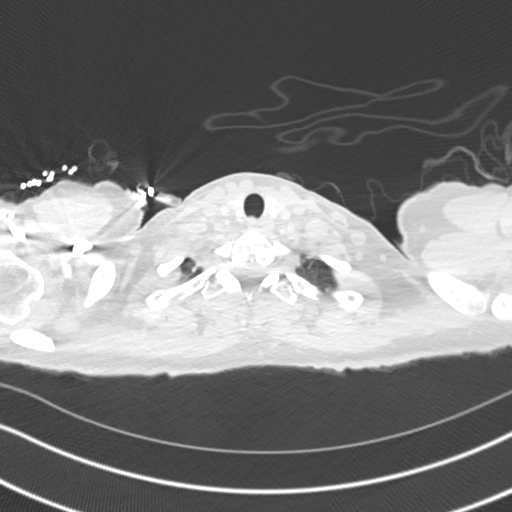
[im 430/453  mediastinal]
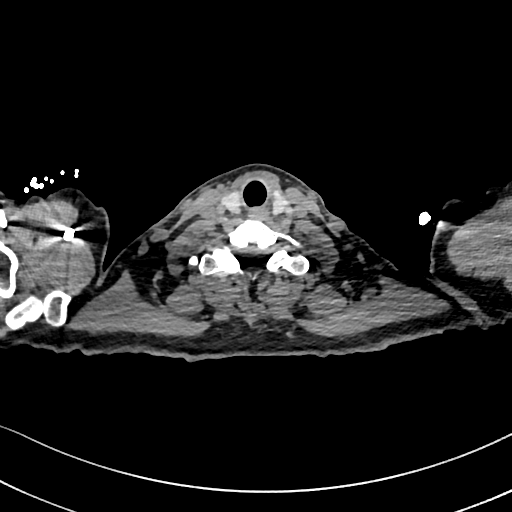

[Series 8: pe 2mm cor · coronal · 0.66mm/px · 1 of 122 slices shown]
[im 61/122  mediastinal]
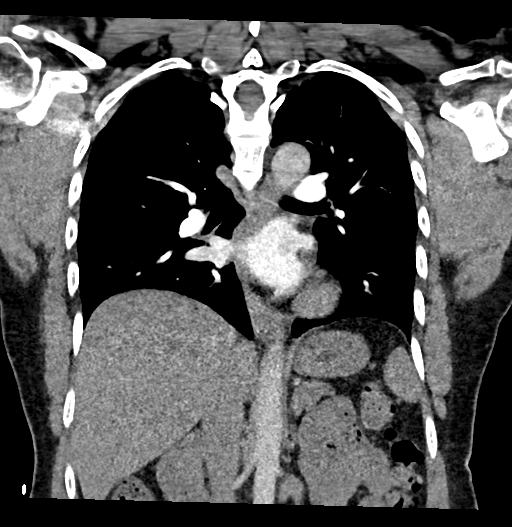

[19 of 36 positions shown; findings below may reference images not displayed]

FINDINGS: Cardiovascular: There are no filling defects within the pulmonary
arteries to suggest pulmonary embolus. There is sick aorta is normal
in caliber. Heart is normal in size. No pericardial effusion.

Mediastinum/Nodes: No mediastinal or hilar adenopathy. No visualized
thyroid nodule. Decompressed esophagus.

Lungs/Pleura: Hypoventilatory changes in the dependent lungs. No
focal airspace disease. No pleural fluid. No findings of pulmonary
edema. The trachea and central bronchi are patent. No evidence of
pulmonary mass or nodule.

Upper Abdomen: No acute or unexpected findings.

Musculoskeletal: There are no acute or suspicious osseous
abnormalities. Incidental vertebral body hemangioma in T8. No chest
wall soft tissue abnormality.

Review of the MIP images confirms the above findings.
IMPRESSION: No pulmonary embolus or acute intrathoracic abnormality.

## 2024-01-02 ENCOUNTER — Emergency Department (HOSPITAL_BASED_OUTPATIENT_CLINIC_OR_DEPARTMENT_OTHER)

## 2024-01-02 ENCOUNTER — Emergency Department (HOSPITAL_BASED_OUTPATIENT_CLINIC_OR_DEPARTMENT_OTHER)
Admission: EM | Admit: 2024-01-02 | Discharge: 2024-01-02 | Disposition: A | Discharge status: Home / Self Care | Attending: Emergency Medicine | Admitting: Emergency Medicine

## 2024-01-02 ENCOUNTER — Encounter (HOSPITAL_BASED_OUTPATIENT_CLINIC_OR_DEPARTMENT_OTHER): Payer: Self-pay | Admitting: Emergency Medicine

## 2024-01-02 ENCOUNTER — Other Ambulatory Visit: Payer: Self-pay

## 2024-01-02 DIAGNOSIS — R1110 Vomiting, unspecified: Secondary | ICD-10-CM | POA: Insufficient documentation

## 2024-01-02 DIAGNOSIS — Z7982 Long term (current) use of aspirin: Secondary | ICD-10-CM | POA: Diagnosis not present

## 2024-01-02 DIAGNOSIS — R519 Headache, unspecified: Secondary | ICD-10-CM | POA: Insufficient documentation

## 2024-01-02 LAB — COMPREHENSIVE METABOLIC PANEL WITH GFR
ALT: 29 U/L (ref 0–44)
AST: 28 U/L (ref 15–41)
Albumin: 4.7 g/dL (ref 3.5–5.0)
Alkaline Phosphatase: 60 U/L (ref 38–126)
Anion gap: 15 (ref 5–15)
BUN: 19 mg/dL (ref 6–20)
CO2: 23 mmol/L (ref 22–32)
Calcium: 9.9 mg/dL (ref 8.9–10.3)
Chloride: 102 mmol/L (ref 98–111)
Creatinine, Ser: 0.83 mg/dL (ref 0.44–1.00)
GFR, Estimated: >60 mL/min (ref >60)
Glucose, Bld: 113 mg/dL — ABNORMAL HIGH (ref 70–99)
Potassium: 3.6 mmol/L (ref 3.5–5.1)
Sodium: 140 mmol/L (ref 135–145)
Total Bilirubin: 0.4 mg/dL (ref 0.0–1.2)
Total Protein: 7.4 g/dL (ref 6.5–8.1)

## 2024-01-02 LAB — CBC
HCT: 43.6 % (ref 36.0–46.0)
Hemoglobin: 15 g/dL (ref 12.0–15.0)
MCH: 33.9 pg (ref 26.0–34.0)
MCHC: 34.4 g/dL (ref 30.0–36.0)
MCV: 98.4 fL (ref 80.0–100.0)
Platelets: 319 K/uL (ref 150–400)
RBC: 4.43 MIL/uL (ref 3.87–5.11)
RDW: 11.8 % (ref 11.5–15.5)
WBC: 19 K/uL — ABNORMAL HIGH (ref 4.0–10.5)
nRBC: 0 % (ref 0.0–0.2)

## 2024-01-02 LAB — DIFFERENTIAL
Abs Immature Granulocytes: 0.11 K/uL — ABNORMAL HIGH (ref 0.00–0.07)
Basophils Absolute: 0.1 K/uL (ref 0.0–0.1)
Basophils Relative: 0 %
Eosinophils Absolute: 0.1 K/uL (ref 0.0–0.5)
Eosinophils Relative: 1 %
Immature Granulocytes: 1 %
Lymphocytes Relative: 5 %
Lymphs Abs: 1 K/uL (ref 0.7–4.0)
Monocytes Absolute: 1.2 K/uL — ABNORMAL HIGH (ref 0.1–1.0)
Monocytes Relative: 6 %
Neutro Abs: 16.8 K/uL — ABNORMAL HIGH (ref 1.7–7.7)
Neutrophils Relative %: 87 %

## 2024-01-02 LAB — LIPASE, BLOOD: Lipase: 22 U/L (ref 11–51)

## 2024-01-02 MED ORDER — DIPHENHYDRAMINE HCL 50 MG/ML IJ SOLN
12.5000 mg | Freq: Once | INTRAMUSCULAR | Status: AC
  Administered 2024-01-02: 12.5 mg via INTRAVENOUS
  Filled 2024-01-02: qty 1

## 2024-01-02 MED ORDER — METOCLOPRAMIDE HCL 10 MG PO TABS: 10.0000 mg | ORAL_TABLET | Freq: Four times a day (QID) | ORAL | 0 refills | Status: AC

## 2024-01-02 MED ORDER — ONDANSETRON 4 MG PO TBDP: 4.0000 mg | ORAL_TABLET | Freq: Three times a day (TID) | ORAL | 0 refills | Status: AC | PRN

## 2024-01-02 MED ORDER — SODIUM CHLORIDE 0.9 % IV BOLUS
1000.0000 mL | Freq: Once | INTRAVENOUS | Status: AC
  Administered 2024-01-02: 1000 mL via INTRAVENOUS

## 2024-01-02 MED ORDER — DEXAMETHASONE SODIUM PHOSPHATE 10 MG/ML IJ SOLN
10.0000 mg | Freq: Once | INTRAMUSCULAR | Status: AC
  Administered 2024-01-02: 10 mg via INTRAVENOUS
  Filled 2024-01-02: qty 1

## 2024-01-02 MED ORDER — PROCHLORPERAZINE EDISYLATE 10 MG/2ML IJ SOLN
10.0000 mg | Freq: Once | INTRAMUSCULAR | Status: AC
  Administered 2024-01-02: 10 mg via INTRAVENOUS
  Filled 2024-01-02: qty 2

## 2024-01-02 NOTE — ED Triage Notes (Signed)
 Pt reports HA x 2 weeks that increased today and began having n/v/d  Prev hx of migraines but has not had one in years

## 2024-01-02 NOTE — Discharge Instructions (Signed)
 Take Zofran  as needed for nausea or vomiting.  If you continue to have headache you can take 10 mg of Reglan  with 1000 mg of Tylenol.  Neurology should call you to schedule an appointment given new daily persistent headaches.  Return to emergency room with new or worsening symptoms.

## 2024-01-02 NOTE — ED Provider Notes (Signed)
 [facility] Provider Note   CSN: [id number] Arrival date & time: 01/02/24  1656     Patient presents with: Emesis and Headache   Robin Brennan is a 53 y.o. female with noncontributory past medical history reports to emergency room with complaint of headache.  Patient reports that she has had intermittent headache for approximately 2 weeks.  She reports she typically wakes up headache free and they gradually progressive the day.  She locates her headache to her forehead bilaterally.  Today she came in because she had headache associated with nausea vomiting and diarrhea.  Notes multiple episodes of nonbilious nonbloody vomit.  She reports she feels dehydrated which she feels is likely contributing to symptoms.  She denies any injury trauma or fall.  No fever.  No neck pain or neck stiffness.    Emesis Associated symptoms: headaches   Headache Associated symptoms: vomiting        Prior to Admission medications   Medication Sig Start Date End Date Taking? Authorizing Provider  metoCLOPramide  (REGLAN ) 10 MG tablet Take 1 tablet (10 mg total) by mouth every 6 (six) hours. 01/02/24  Yes Margot Oriordan, Warren SAILOR, PA-C  ondansetron  (ZOFRAN -ODT) 4 MG disintegrating tablet Take 1 tablet (4 mg total) by mouth every 8 (eight) hours as needed for nausea or vomiting. 01/02/24  Yes Yuma Blucher, Warren SAILOR, PA-C  aspirin-acetaminophen-caffeine (EXCEDRIN MIGRAINE) 250-250-65 MG tablet Take 2 tablets by mouth every 6 (six) hours as needed for headache.    [provider]  Cholecalciferol (VITAMIN D-3) 125 MCG (5000 UT) TABS Take 250 mcg by mouth daily.    [provider]  cyanocobalamin (,VITAMIN B-12,) 1000 MCG/ML injection Inject 1,000 mcg into the muscle once a week.    [provider]  eszopiclone (LUNESTA) 1 MG TABS tablet Take 2 mg by mouth at bedtime. 08/31/20   [provider]  ferrous sulfate 325 (65 FE) MG tablet Take 325 mg by  mouth daily.    [provider]  ibuprofen (ADVIL,MOTRIN) 200 MG tablet Take 200 mg by mouth every 6 (six) hours as needed for pain.    [provider]  NP THYROID 60 MG tablet Take 60 mg by mouth daily. 09/06/20   [provider]    Allergies: Aloe, Gluten meal, Tape, Augmentin [amoxicillin-pot clavulanate], and Sulfa antibiotics    Review of Systems  Gastrointestinal:  Positive for vomiting.  Neurological:  Positive for headaches.    Updated Vital Signs BP 110/75 (BP Location: Left Arm)   Pulse 70   Temp 97.7 F (36.5 C) (Oral)   Resp 16   Ht 5' 8 (1.727 m)   Wt 64.9 kg   SpO2 99%   BMI 21.74 kg/m   Physical Exam Vitals and nursing note reviewed.  Constitutional:      General: She is not in acute distress.    Appearance: She is not toxic-appearing.  HENT:     Head: Normocephalic and atraumatic.  Eyes:     General: No scleral icterus.    Conjunctiva/sclera: Conjunctivae normal.  Cardiovascular:     Rate and Rhythm: Normal rate and regular rhythm.     Pulses: Normal pulses.     Heart sounds: Normal heart sounds.  Pulmonary:     Effort: Pulmonary effort is normal. No respiratory distress.     Breath sounds: Normal breath sounds.  Abdominal:     General: Abdomen is flat. Bowel sounds are normal.     Palpations:  Abdomen is soft.     Tenderness: There is no abdominal tenderness.  Skin:    General: Skin is warm and dry.     Findings: No lesion.  Neurological:     General: No focal deficit present.     Mental Status: She is alert and oriented to person, place, and time. Mental status is at baseline.     GCS: GCS eye subscore is 4. GCS verbal subscore is 5. GCS motor subscore is 6.     Comments: No meningismus on exam. Patient is alert oriented answering questions appropriately with no slurred speech.  No facial droop.  Pupils equal and reactive.  No nystagmus.  Upper and lower extremity without any sensation changes.  Upper and lower extremity  strength equal bilaterally.  No ataxia.     (all labs ordered are listed, but only abnormal results are displayed) Labs Reviewed  COMPREHENSIVE METABOLIC PANEL WITH GFR - Abnormal; Notable for the following components:      Result Value   Glucose, Bld 113 (*)    All other components within normal limits  CBC - Abnormal; Notable for the following components:   WBC 19.0 (*)    All other components within normal limits  DIFFERENTIAL - Abnormal; Notable for the following components:   Neutro Abs 16.8 (*)    Monocytes Absolute 1.2 (*)    Abs Immature Granulocytes 0.11 (*)    All other components within normal limits  LIPASE, BLOOD  URINALYSIS, ROUTINE W REFLEX MICROSCOPIC    EKG: None  Radiology: CT Head Wo Contrast Result Date: 01/02/2024 CLINICAL DATA:  Headache, increasing frequency or severity EXAM: CT HEAD WITHOUT CONTRAST TECHNIQUE: Contiguous axial images were obtained from the base of the skull through the vertex without intravenous contrast. RADIATION DOSE REDUCTION: This exam was performed according to the departmental dose-optimization program which includes automated exposure control, adjustment of the mA and/or kV according to patient size and/or use of iterative reconstruction technique. COMPARISON:  CT head 11/23/2020 FINDINGS: Brain: No evidence of large-territorial acute infarction. No parenchymal hemorrhage. No mass lesion. No extra-axial collection. No mass effect or midline shift. No hydrocephalus. Basilar cisterns are patent. Vascular: No hyperdense vessel. Skull: No acute fracture or focal lesion. Sinuses/Orbits: Paranasal sinuses and mastoid air cells are clear. The orbits are unremarkable. Other: None. IMPRESSION: No acute intracranial abnormality. Electronically Signed   By: Morgane  Naveau M.D.   On: 01/02/2024 19:21     Procedures   Medications Ordered in the ED  sodium chloride  0.9 % bolus 1,000 mL ( Intravenous Stopped 01/02/24 1806)  prochlorperazine   (COMPAZINE ) injection 10 mg (10 mg Intravenous Given 01/02/24 1741)  diphenhydrAMINE  (BENADRYL ) injection 12.5 mg (12.5 mg Intravenous Given 01/02/24 1739)  dexamethasone  (DECADRON ) injection 10 mg (10 mg Intravenous Given 01/02/24 1735)                                    Medical Decision Making Amount and/or Complexity of Data Reviewed Labs: ordered. Radiology: ordered.  Risk Prescription drug management.   Robin Brennan 53 y.o. presented today for HA. Working Ddx: tension headache, migraine, intracranial mass, intracranial hemorrhage, intracranial infection including meningitis vs encephalitis, trigeminal neuralgia, AVM, sinusitis, cerebral aneurysm, muscular headache, cavernous sinus thrombosis, carotid artery dissection.  R/o DDx:  intracranial mass, hemorrhage,or infection including meningitis vs encephalitis, trigeminal neuralgia, AVM, cerebral aneurysm, muscular headache, cavernous sinus thrombosis, carotid artery dissection are less likely due  to history of present illness, physical exam, labs/imaging findings  Patient has leukocytosis of 19 but not endorsing infectious symptoms like fever, body aches or cough.  CMP with no abnormal LFTs, normal kidney function.  Head CT shows no acute abnormality.  Problem List / ED Course / Critical interventions / Medication management  Patient presenting with headache.  They get gradually worse show today.  Describe them as tension-like in nature.  Not associated with vision changes or focal deficits.  She has no meningeal signs.  Her head CT is negative.  She is feeling better after migraine cocktail.  Feel she is appropriate for discharge with outpatient follow-up. I ordered medication including Decadron , Benadryl , Compazine , 1 L normal saline Reevaluation of the patient after these medicines showed that the patient improved Patients vitals assessed. Upon arrival patient is hemodynamically stable.  I have reviewed the patients home  medicines and have made adjustments as needed Given patient's new daily persistent headaches will refer to neurology.  Was given return precautions.      Final diagnoses:  Bad headache    ED Discharge Orders          Ordered    Ambulatory referral to Neurology       Comments: An appointment is requested in approximately: 4 weeks   01/02/24 1931    ondansetron  (ZOFRAN -ODT) 4 MG disintegrating tablet  Every 8 hours PRN        01/02/24 1931    metoCLOPramide  (REGLAN ) 10 MG tablet  Every 6 hours        01/02/24 1931               Seville Downs, Jayley Hustead, PA-C 01/02/24 WINDELL Ruthe Cornet, DO 01/02/24 [phone]
